# Patient Record
Sex: Female | Born: 1975 | Race: White | Hispanic: Yes | Marital: Married | Smoking: Never smoker
Health system: Southern US, Community
[De-identification: ages and names within clinical notes are randomized; demographics above are authoritative.]

## PROBLEM LIST (undated history)

## (undated) DIAGNOSIS — K219 Gastro-esophageal reflux disease without esophagitis: Secondary | ICD-10-CM

## (undated) HISTORY — DX: Gastro-esophageal reflux disease without esophagitis: K21.9

## (undated) HISTORY — PX: TUBAL LIGATION: SHX77

---

## 2008-04-25 ENCOUNTER — Ambulatory Visit: Payer: Self-pay | Admitting: Certified Nurse Midwife

## 2019-04-06 ENCOUNTER — Ambulatory Visit: Payer: Self-pay

## 2019-04-08 ENCOUNTER — Ambulatory Visit: Payer: Self-pay | Attending: Internal Medicine

## 2019-04-08 DIAGNOSIS — Z23 Encounter for immunization: Secondary | ICD-10-CM

## 2019-04-08 NOTE — Progress Notes (Signed)
   Covid-19 Vaccination Clinic  Name:  Linda Kaufman    MRN: [id number] DOB: 04-May-1975  04/08/2019  Ms. Linda Kaufman was observed post Covid-19 immunization for 15 minutes without incident. She was provided with Vaccine Information Sheet and instruction to access the V-Safe system.   Ms. Linda Kaufman was instructed to call 911 with any severe reactions post vaccine: Marland Kitchen Difficulty breathing  . Swelling of face and throat  . A fast heartbeat  . A bad rash all over body  . Dizziness and weakness   Immunizations Administered    Name Date Dose VIS Date Route   Pfizer COVID-19 Vaccine 04/08/2019  9:47 AM 0.3 mL 12/14/2018 Intramuscular   Manufacturer: [employer], [employer]   Lot: [id number]   NDC: [address]-3

## 2019-04-29 ENCOUNTER — Ambulatory Visit: Payer: Self-pay | Attending: Internal Medicine

## 2019-04-29 DIAGNOSIS — Z23 Encounter for immunization: Secondary | ICD-10-CM

## 2019-04-29 NOTE — Progress Notes (Signed)
   Covid-19 Vaccination Clinic  Name:  Linda Kaufman    MRN: [id number] DOB: Jul 16, 1975  04/29/2019  Ms. Linda Kaufman was observed post Covid-19 immunization for 15 minutes without incident. She was provided with Vaccine Information Sheet and instruction to access the V-Safe system.   Ms. Linda Kaufman was instructed to call 911 with any severe reactions post vaccine: Marland Kitchen Difficulty breathing  . Swelling of face and throat  . A fast heartbeat  . A bad rash all over body  . Dizziness and weakness   Immunizations Administered    Name Date Dose VIS Date Route   Pfizer COVID-19 Vaccine 04/29/2019 10:01 AM 0.3 mL 02/27/2018 Intramuscular   Manufacturer: [employer], [employer]   Lot: [id number]   NDC: [address]-4

## 2019-05-03 ENCOUNTER — Ambulatory Visit: Payer: Self-pay

## 2019-10-17 ENCOUNTER — Encounter: Payer: Self-pay | Admitting: *Deleted

## 2019-10-17 ENCOUNTER — Emergency Department: Payer: Self-pay

## 2019-10-17 ENCOUNTER — Other Ambulatory Visit: Payer: Self-pay

## 2019-10-17 ENCOUNTER — Emergency Department
Admission: EM | Admit: 2019-10-17 | Discharge: 2019-10-17 | Disposition: A | Discharge status: Home / Self Care | Payer: Self-pay | Attending: Student in an Organized Health Care Education/Training Program | Admitting: Student in an Organized Health Care Education/Training Program

## 2019-10-17 DIAGNOSIS — R1011 Right upper quadrant pain: Secondary | ICD-10-CM

## 2019-10-17 DIAGNOSIS — K8020 Calculus of gallbladder without cholecystitis without obstruction: Secondary | ICD-10-CM | POA: Insufficient documentation

## 2019-10-17 DIAGNOSIS — R1013 Epigastric pain: Secondary | ICD-10-CM

## 2019-10-17 LAB — POCT PREGNANCY, URINE: Preg Test, Ur: NEGATIVE

## 2019-10-17 LAB — URINALYSIS, COMPLETE (UACMP) WITH MICROSCOPIC
Bilirubin Urine: NEGATIVE
Glucose, UA: NEGATIVE mg/dL
Hgb urine dipstick: NEGATIVE
Ketones, ur: NEGATIVE mg/dL
Leukocytes,Ua: NEGATIVE
Nitrite: NEGATIVE
Protein, ur: NEGATIVE mg/dL
Specific Gravity, Urine: 1.011 (ref 1.005–1.030)
pH: 6 (ref 5.0–8.0)

## 2019-10-17 LAB — COMPREHENSIVE METABOLIC PANEL
ALT: 33 U/L (ref 0–44)
AST: 30 U/L (ref 15–41)
Albumin: 4.8 g/dL (ref 3.5–5.0)
Alkaline Phosphatase: 113 U/L (ref 38–126)
Anion gap: 13 (ref 5–15)
BUN: 9 mg/dL (ref 6–20)
CO2: 24 mmol/L (ref 22–32)
Calcium: 9.3 mg/dL (ref 8.9–10.3)
Chloride: 102 mmol/L (ref 98–111)
Creatinine, Ser: 0.56 mg/dL (ref 0.44–1.00)
GFR, Estimated: >60 mL/min (ref >60)
Glucose, Bld: 138 mg/dL — ABNORMAL HIGH (ref 70–99)
Potassium: 2.9 mmol/L — ABNORMAL LOW (ref 3.5–5.1)
Sodium: 139 mmol/L (ref 135–145)
Total Bilirubin: 0.7 mg/dL (ref 0.3–1.2)
Total Protein: 7.9 g/dL (ref 6.5–8.1)

## 2019-10-17 LAB — CBC
HCT: 42.1 % (ref 36.0–46.0)
Hemoglobin: 14.7 g/dL (ref 12.0–15.0)
MCH: 30.4 pg (ref 26.0–34.0)
MCHC: 34.9 g/dL (ref 30.0–36.0)
MCV: 87 fL (ref 80.0–100.0)
Platelets: 267 10*3/uL (ref 150–400)
RBC: 4.84 MIL/uL (ref 3.87–5.11)
RDW: 12.8 % (ref 11.5–15.5)
WBC: 8.1 10*3/uL (ref 4.0–10.5)
nRBC: 0 % (ref 0.0–0.2)

## 2019-10-17 LAB — LIPASE, BLOOD: Lipase: 23 U/L (ref 11–51)

## 2019-10-17 LAB — TROPONIN I (HIGH SENSITIVITY): Troponin I (High Sensitivity): 4 ng/L (ref <18)

## 2019-10-17 MED ORDER — ONDANSETRON HCL 4 MG/2ML IJ SOLN
4.0000 mg | Freq: Once | INTRAMUSCULAR | Status: AC | PRN
  Administered 2019-10-17: 4 mg via INTRAVENOUS
  Filled 2019-10-17: qty 2

## 2019-10-17 MED ORDER — HYDROCODONE-ACETAMINOPHEN 5-325 MG PO TABS
1.0000 | ORAL_TABLET | ORAL | 0 refills | Status: DC | PRN
Start: 2019-10-17 — End: 2022-07-28

## 2019-10-17 MED ORDER — FENTANYL CITRATE (PF) 100 MCG/2ML IJ SOLN
50.0000 ug | INTRAMUSCULAR | Status: DC | PRN
  Administered 2019-10-17: 50 ug via INTRAVENOUS
  Filled 2019-10-17: qty 2

## 2019-10-17 MED ORDER — ONDANSETRON HCL 4 MG PO TABS: 4.0000 mg | ORAL_TABLET | Freq: Every day | ORAL | 0 refills | Status: AC | PRN

## 2019-10-17 MED ORDER — ONDANSETRON 4 MG PO TBDP
ORAL_TABLET | ORAL | Status: AC
  Administered 2019-10-17: 4 mg via ORAL
  Filled 2019-10-17: qty 1

## 2019-10-17 MED ORDER — ONDANSETRON 4 MG PO TBDP: 4.0000 mg | ORAL_TABLET | Freq: Once | ORAL | Status: AC

## 2019-10-17 NOTE — ED Triage Notes (Signed)
Pt presents w/ epigastric pain starting x 1 hr ago. Pt has had epigastric pain like this before which has resolved w/o intervention and has never seen a physician about this. Pt is presently vomiting and diaphoretic.

## 2019-10-17 NOTE — ED Provider Notes (Signed)
[facility] Emergency Department Provider Note    First MD Initiated Contact with Patient 10/17/19 1214     (approximate)  I have reviewed the triage vital signs and the nursing notes.   HISTORY  Chief Complaint Abdominal Pain    HPI Linda Kaufman is a 44 y.o. female presents to the ER for evaluation of epigastric pain that awoke her from sleep last night.  States it did radiate through to her back.  Did not have any chest pain or shortness of breath associated with this.  No fevers.  Has had similar episodes in the past this 1 was more severe and lasted longer.  She denies any dysuria.  No melena.  No hematochezia.  No pressure.  Did have vomiting associated with nonbilious nonbloody.    History reviewed. No pertinent past medical history. History reviewed. No pertinent family history. History reviewed. No pertinent surgical history. There are no problems to display for this patient.     Prior to Admission medications   Medication Sig Start Date End Date Taking? Authorizing Provider  HYDROcodone-acetaminophen (NORCO) 5-325 MG tablet Take 1 tablet by mouth every 4 (four) hours as needed for moderate pain. 10/17/19   Willy Eddy, MD  ondansetron (ZOFRAN) 4 MG tablet Take 1 tablet (4 mg total) by mouth daily as needed. 10/17/19 10/16/20  Willy Eddy, MD    Allergies Patient has no known allergies.    Social History Social History   Tobacco Use  . Smoking status: Never Smoker  . Smokeless tobacco: Never Used  Vaping Use  . Vaping Use: Never used  Substance Use Topics  . Alcohol use: Never  . Drug use: Never    Review of Systems Patient denies headaches, rhinorrhea, blurry vision, numbness, shortness of breath, chest pain, edema, cough, abdominal pain, nausea, vomiting, diarrhea, dysuria, fevers, rashes or hallucinations unless otherwise stated above in HPI. ____________________________________________   PHYSICAL  EXAM:  VITAL SIGNS: Vitals:   10/17/19 0249 10/17/19 0555  BP: (!) 166/89 133/68  Pulse: 65 (!) 58  Resp: (!) 28 17  Temp: 97.9 F (36.6 C) 98.6 F (37 C)  SpO2: 100% 100%   Constitutional: Alert and oriented.  Eyes: Conjunctivae are normal.  Head: Atraumatic. Nose: No congestion/rhinnorhea. Mouth/Throat: Mucous membranes are moist.   Neck: No stridor. Painless ROM.  Cardiovascular: Normal rate, regular rhythm. Grossly normal heart sounds.  Good peripheral circulation. Respiratory: Normal respiratory effort.  No retractions. Lungs CTAB. Gastrointestinal: Soft without guarding or rebound in all four quadrants,  Mild epigastric ttp.  No distention. No abdominal bruits. No CVA tenderness. Genitourinary:  Musculoskeletal: No lower extremity tenderness nor edema.  No joint effusions. Neurologic:  Normal speech and language. No gross focal neurologic deficits are appreciated. No facial droop Skin:  Skin is warm, dry and intact. No rash noted. Psychiatric: Mood and affect are normal. Speech and behavior are normal.  ____________________________________________   LABS (all labs ordered are listed, but only abnormal results are displayed)  Results for orders placed or performed during the hospital encounter of 10/17/19 (from the past 24 hour(s))  Lipase, blood     Status: None   Collection Time: 10/17/19  3:07 AM  Result Value Ref Range   Lipase 23 11 - 51 U/L  Comprehensive metabolic panel     Status: Abnormal   Collection Time: 10/17/19  3:07 AM  Result Value Ref Range   Sodium 139 135 - 145 mmol/L   Potassium 2.9 (L) 3.5 -  5.1 mmol/L   Chloride 102 98 - 111 mmol/L   CO2 24 22 - 32 mmol/L   Glucose, Bld 138 (H) 70 - 99 mg/dL   BUN 9 6 - 20 mg/dL   Creatinine, Ser 0.86 0.44 - 1.00 mg/dL   Calcium 9.3 8.9 - 76.1 mg/dL   Total Protein 7.9 6.5 - 8.1 g/dL   Albumin 4.8 3.5 - 5.0 g/dL   AST 30 15 - 41 U/L   ALT 33 0 - 44 U/L   Alkaline Phosphatase 113 38 - 126 U/L   Total  Bilirubin 0.7 0.3 - 1.2 mg/dL   GFR, Estimated >95 >09 mL/min   Anion gap 13 5 - 15  CBC     Status: None   Collection Time: 10/17/19  3:07 AM  Result Value Ref Range   WBC 8.1 4.0 - 10.5 K/uL   RBC 4.84 3.87 - 5.11 MIL/uL   Hemoglobin 14.7 12.0 - 15.0 g/dL   HCT 32.6 36 - 46 %   MCV 87.0 80.0 - 100.0 fL   MCH 30.4 26.0 - 34.0 pg   MCHC 34.9 30.0 - 36.0 g/dL   RDW 71.2 45.8 - 09.9 %   Platelets 267 150 - 400 K/uL   nRBC 0.0 0.0 - 0.2 %  Urinalysis, Complete w Microscopic     Status: Abnormal   Collection Time: 10/17/19  3:07 AM  Result Value Ref Range   Color, Urine STRAW (A) YELLOW   APPearance CLEAR (A) CLEAR   Specific Gravity, Urine 1.011 1.005 - 1.030   pH 6.0 5.0 - 8.0   Glucose, UA NEGATIVE NEGATIVE mg/dL   Hgb urine dipstick NEGATIVE NEGATIVE   Bilirubin Urine NEGATIVE NEGATIVE   Ketones, ur NEGATIVE NEGATIVE mg/dL   Protein, ur NEGATIVE NEGATIVE mg/dL   Nitrite NEGATIVE NEGATIVE   Leukocytes,Ua NEGATIVE NEGATIVE   WBC, UA 0-5 0 - 5 WBC/hpf   Bacteria, UA RARE (A) NONE SEEN   Squamous Epithelial / LPF 0-5 0 - 5   Mucus PRESENT   Troponin I (High Sensitivity)     Status: None   Collection Time: 10/17/19  3:07 AM  Result Value Ref Range   Troponin I (High Sensitivity) 4 <18 ng/L  Pregnancy, urine POC     Status: None   Collection Time: 10/17/19  3:24 AM  Result Value Ref Range   Preg Test, Ur NEGATIVE NEGATIVE   ____________________________________________  EKG My review and personal interpretation at Time: 2:47   Indication: abd pain  Rate: 70  Rhythm: sinus Axis: mpr,a; Other: normal intervals, no stemi,  ____________________________________________  RADIOLOGY  I personally reviewed all radiographic images ordered to evaluate for the above acute complaints and reviewed radiology reports and findings.  These findings were personally discussed with the patient.  Please see medical record for radiology  report.  ____________________________________________   PROCEDURES  Procedure(s) performed:  Procedures    Critical Care performed: no ____________________________________________   INITIAL IMPRESSION / ASSESSMENT AND PLAN / ED COURSE  Pertinent labs & imaging results that were available during my care of the patient were reviewed by me and considered in my medical decision making (see chart for details).   DDX: Cholelithiasis, cholecystitis, pancreatitis, gastritis, enteritis, SBO, perforation, pneumonia, ACS  Linda Kaufman is a 44 y.o. who presents to the ED with presentation as described above.  Patient clinically well-appearing in no acute distress.  Has reassuring blood work as well as ultrasound showing evidence of cholelithiasis without evidence of  acute cholecystitis given her clinical presentation without fever right upper quadrant tenderness or leukocytosis.  No elevation of her bilirubin or transaminitis.  Lipase is normal.  No evidence of pneumonia.  Not consistent with ACS given nonischemic EKG and negative troponin.  Does have a history of gastritis and reflux.  May been having episode of that but I do suspect she may be having biliary colic given ultrasound findings therefore will give outpatient referral.  Discussed strict return precautions.  Clinical Course as of Oct 16 1324  Thu Oct 17, 2019  1322 Patient's work-up here is reassuring I do suspect biliary colic.  Repeat abdominal exam is benign.  Patient tolerating p.o.  Will give referral to outpatient specialist.  We discussed strict return precautions.  Have discussed with the patient and available family all diagnostics and treatments performed thus far and all questions were answered to the best of my ability. The patient demonstrates understanding and agreement with plan.    [PR]    Clinical Course User Index [PR] Willy Eddy, MD    The patient was evaluated in Emergency Department today for the  symptoms described in the history of present illness. He/she was evaluated in the context of the global COVID-19 pandemic, which necessitated consideration that the patient might be at risk for infection with the SARS-CoV-2 virus that causes COVID-19. Institutional protocols and algorithms that pertain to the evaluation of patients at risk for COVID-19 are in a state of rapid change based on information released by regulatory bodies including the CDC and federal and state organizations. These policies and algorithms were followed during the patient's care in the ED.  As part of my medical decision making, I reviewed the following data within the electronic MEDICAL RECORD NUMBER Nursing notes reviewed and incorporated, Labs reviewed, notes from prior ED visits and [place] Controlled Substance Database   ____________________________________________   FINAL CLINICAL IMPRESSION(S) / ED DIAGNOSES  Final diagnoses:  RUQ abdominal pain  Epigastric pain  Calculus of gallbladder without cholecystitis without obstruction      NEW MEDICATIONS STARTED DURING THIS VISIT:  New Prescriptions   HYDROCODONE-ACETAMINOPHEN (NORCO) 5-325 MG TABLET    Take 1 tablet by mouth every 4 (four) hours as needed for moderate pain.   ONDANSETRON (ZOFRAN) 4 MG TABLET    Take 1 tablet (4 mg total) by mouth daily as needed.     Note:  This document was prepared using Dragon voice recognition software and may include unintentional dictation errors.    Willy Eddy, MD 10/17/19 1326

## 2019-10-17 NOTE — ED Notes (Signed)
Tele-interpreter used for d/c instructions. Pt and family verbalized understanding at this time. Pt and family deny questions at this time.   E sign not working at this time

## 2019-10-17 NOTE — ED Notes (Signed)
Interpreter and X-Ray at bedside at this time

## 2019-10-23 ENCOUNTER — Ambulatory Visit: Payer: Self-pay | Admitting: Surgery

## 2019-10-23 NOTE — H&P (View-Only) (Signed)
Subjective:   CC: Biliary colic [K80.50]  HPI:  Linda Kaufman is a 44 y.o. female who was referred by Emergency Room for evaluation of above CC. Symptoms were first noted several days ago. Pain is sharp and worsening, confined to the epigastric area, without radiation.  Associated with nausea/vomting, exacerbated by nothing specific.  Pain intensity prompted ED visit, workup showed gallstones, so referred here.  Reports several similar episodes past year or so, not as intense as this time.  Has tried some PPIs with no resolution of episodes.  No issues inbetween these episodes, mostly occurs night, early am.     Past Medical History: none reported  Past Surgical History: none reported  Family History: none reported   Social History: denies any smoking, alcohol, illicit drug use  Current Medications: omeprazole  Allergies:  Allergies as of 10/22/2019  . (No Known Allergies)    ROS:  A 15 point review of systems was performed and pertinent positives and negatives noted in HPI    Objective:   BP 132/79   Pulse 69   Ht 147.3 cm (4\' 10" )   Wt 66 kg (145 lb 9.6 oz)   BMI 30.43 kg/m    Constitutional :  alert, appears stated age and cooperative  Lymphatics/Throat:  no asymmetry, masses, or scars  Respiratory:  clear to auscultation bilaterally  Cardiovascular:  regular rate and rhythm, S1, S2 normal, no murmur, click, rub or gallop  Gastrointestinal: soft, non-tender; bowel sounds normal; no masses,  no organomegaly.    Musculoskeletal: Steady gait and movement  Skin: Cool and moist  Psychiatric: Normal affect, non-agitated, not confused       LABS:  n/a   RADS: CLINICAL DATA: Right upper quadrant abdominal pain.   EXAM:  ULTRASOUND ABDOMEN LIMITED RIGHT UPPER QUADRANT   COMPARISON: None.   FINDINGS:  Gallbladder:   Multiple shadowing gallstones are present. The gallbladder is  contracted. Wall thickness is upper limits of normal at  3.5 mm.   Common bile duct:   Diameter: 6 mm, within normal limits.   Liver:   The liver is diffusely echogenic. No focal lesions are present.  Internal architecture is not well-defined. Portal vein is patent on  color Doppler imaging with normal direction of blood flow towards  the liver.   Other: None.   IMPRESSION:  1. Cholelithiasis.  2. Borderline gallbladder wall thickening is likely due to  contracted state without definite inflammatory change.  3. Diffuse fatty infiltration of the liver.    Electronically Signed  By: M.D.  On: 10/17/2019 04:56  Assessment:      Biliary colic [K80.50], history consistent with no other obvious source of pain, so will proceed with surgery.    Plan:   1. Biliary colic [K80.50] Discussed the risk of surgery including post-op infxn, seroma, biloma, chronic pain, poor-delayed wound healing, retained gallstone, conversion to open procedure, post-op SBO or ileus, and need for additional procedures to address said risks.  The risks of general anesthetic including MI, CVA, sudden death or even reaction to anesthetic medications also discussed. Alternatives include continued observation.  Benefits include possible symptom relief, prevention of complications including acute cholecystitis, pancreatitis.  Typical post operative recovery of 3-5 days rest, continued pain in area and incision sites, possible loose stools up to 4-6 weeks, also discussed.  ED return precautions given for sudden increase in RUQ pain, with possible accompanying fever, nausea, and/or vomiting.  The patient understands the risks, any and all questions  were answered to the patient's satisfaction.  2. Patient has elected to proceed with surgical treatment. Procedure will be scheduled.  Written consent was obtained..robotic assisted laparoscopic.  Pt understands this will be self pay with significant expense on her.    Entire encounter  performed via interpreter service.

## 2019-10-23 NOTE — H&P (Signed)
Subjective:   CC: Biliary colic [K80.50]  HPI:  Linda Kaufman is a 44 y.o. female who was referred by Emergency Room for evaluation of above CC. Symptoms were first noted several days ago. Pain is sharp and worsening, confined to the epigastric area, without radiation.  Associated with nausea/vomting, exacerbated by nothing specific.  Pain intensity prompted ED visit, workup showed gallstones, so referred here.  Reports several similar episodes past year or so, not as intense as this time.  Has tried some PPIs with no resolution of episodes.  No issues inbetween these episodes, mostly occurs night, early am.     Past Medical History: none reported  Past Surgical History: none reported  Family History: none reported   Social History: denies any smoking, alcohol, illicit drug use  Current Medications: omeprazole  Allergies:  Allergies as of 10/22/2019  . (No Known Allergies)    ROS:  A 15 point review of systems was performed and pertinent positives and negatives noted in HPI    Objective:   BP 132/79   Pulse 69   Ht 147.3 cm (4' 10")   Wt 66 kg (145 lb 9.6 oz)   BMI 30.43 kg/m    Constitutional :  alert, appears stated age and cooperative  Lymphatics/Throat:  no asymmetry, masses, or scars  Respiratory:  clear to auscultation bilaterally  Cardiovascular:  regular rate and rhythm, S1, S2 normal, no murmur, click, rub or gallop  Gastrointestinal: soft, non-tender; bowel sounds normal; no masses,  no organomegaly.    Musculoskeletal: Steady gait and movement  Skin: Cool and moist  Psychiatric: Normal affect, non-agitated, not confused       LABS:  n/a   RADS: CLINICAL DATA: Right upper quadrant abdominal pain.   EXAM:  ULTRASOUND ABDOMEN LIMITED RIGHT UPPER QUADRANT   COMPARISON: None.   FINDINGS:  Gallbladder:   Multiple shadowing gallstones are present. The gallbladder is  contracted. Wall thickness is upper limits of normal at  3.5 mm.   Common bile duct:   Diameter: 6 mm, within normal limits.   Liver:   The liver is diffusely echogenic. No focal lesions are present.  Internal architecture is not well-defined. Portal vein is patent on  color Doppler imaging with normal direction of blood flow towards  the liver.   Other: None.   IMPRESSION:  1. Cholelithiasis.  2. Borderline gallbladder wall thickening is likely due to  contracted state without definite inflammatory change.  3. Diffuse fatty infiltration of the liver.    Electronically Signed  By: Christopher Mattern M.D.  On: 10/17/2019 04:56  Assessment:      Biliary colic [K80.50], history consistent with no other obvious source of pain, so will proceed with surgery.    Plan:   1. Biliary colic [K80.50] Discussed the risk of surgery including post-op infxn, seroma, biloma, chronic pain, poor-delayed wound healing, retained gallstone, conversion to open procedure, post-op SBO or ileus, and need for additional procedures to address said risks.  The risks of general anesthetic including MI, CVA, sudden death or even reaction to anesthetic medications also discussed. Alternatives include continued observation.  Benefits include possible symptom relief, prevention of complications including acute cholecystitis, pancreatitis.  Typical post operative recovery of 3-5 days rest, continued pain in area and incision sites, possible loose stools up to 4-6 weeks, also discussed.  ED return precautions given for sudden increase in RUQ pain, with possible accompanying fever, nausea, and/or vomiting.  The patient understands the risks, any and all questions   were answered to the patient's satisfaction.  2. Patient has elected to proceed with surgical treatment. Procedure will be scheduled.  Written consent was obtained..robotic assisted laparoscopic.  Pt understands this will be self pay with significant expense on her.    Entire encounter  performed via interpreter service.

## 2019-10-30 ENCOUNTER — Other Ambulatory Visit: Payer: Self-pay

## 2019-10-30 ENCOUNTER — Encounter
Admission: RE | Admit: 2019-10-30 | Discharge: 2019-10-30 | Disposition: A | Discharge status: Home / Self Care | Payer: Self-pay | Source: Ambulatory Visit | Attending: Surgery | Admitting: Surgery

## 2019-10-30 NOTE — Patient Instructions (Signed)
Your procedure is scheduled on: Thursday November 07, 2019. Su procedimiento est programado para: Jueves 4 de [place] del 2021. Report to Day Surgery inside [facility] 2nd floor. Presntese a: [facility] 2ndo piso. To find out your arrival time please call [phone] between 1PM - 3PM on Wednesday November 06, 2019. Para saber su hora de llegada por favor llame al [phone] Eusebio Me la 1PM - 3PM el da: Miercoles 3 de [place] del 2021.   Remember: Instructions that are not followed completely may result in serious medical risk, up to and including death,  or upon the discretion of your surgeon and anesthesiologist your surgery may need to be rescheduled.  Recuerde: Las instrucciones que no se siguen completamente Armed forces logistics/support/administrative officer en un riesgo de salud grave, incluyendo hasta  la [place] o a discrecin de su cirujano y Scientific laboratory technician, su ciruga se puede posponer.   __X_ 1.Do not eat food after midnight the night before your procedure. No    gum chewing or hard candies. You may drink clear liquids up to 2 hours     before you are scheduled to arrive for your surgery- DO not drink clear     Liquids within 2 hours of the start of your surgery.     Clear Liquids include:    water, apple juice without pulp, clear carbohydrate drink such as    Clearfast of Gartorade, Black Coffee or Tea (Do not add anything to coffee or tea).      No coma nada despus de la medianoche de la noche anterior a su    procedimiento. No coma chicles ni caramelos duros. Puede tomar    lquidos claros hasta 2 horas antes de su hora programada de llegada al     hospital para su procedimiento. No tome lquidos claros durante el     transcurso de las 2 horas de su llegada programada al hospital para su     procedimiento, ya que esto puede llevar a que su procedimiento se    retrase o tenga que volver a Magazine features editor.  Los lquidos claros incluyen:          - Agua o jugo de [place]  sin pulpa          - Bebidas claras con carbohidratos como ClearFast o Gatorade          - Caf negro o t claro (sin leche, sin cremas, no agregue nada al caf ni al t)  No tome nada que no est en esta lista.  Los pacientes con diabetes tipo 1 y tipo 2 solo deben Printmaker.  Llame a la clnica de PreCare o a la unidad de Same Day Surgery si  tiene alguna pregunta sobre estas instrucciones.              _X__ 2.Do Not Smoke or use e-cigarettes For 24 Hours Prior to Your Surgery.    Do not use any chewable tobacco products for at least 6   hours prior to surgery.    No fume ni use cigarrillos electrnicos durante las 24 horas previas    a su [place].  No use ningn producto de tabaco masticable durante   al menos 6 horas antes de la [place].     __X_ 3. No alcohol for 24 hours before or after surgery.    No tome alcohol durante las 24 horas antes ni despus de la [place].   __x__4. Brush your teeth with toothpaste and water, you  may use mouthwash, Do no swallow any toothpaste or mouthwash.    Cepillece los dientes con agua y pasta de dientes, puede usar enjuage buccal, no se trague la pasta de dientes o enjuague buccal.   __x__ 5. Notify your doctor if there is any change in your medical condition (cold,fever, infections).    Informe a su mdico si hay algn cambio en su condicin mdica  (resfriado, fiebre, infecciones).   Do not wear jewelry, make-up, hairpins, clips or nail polish.  No use joyas, maquillajes, pinzas/ganchos para el cabello ni esmalte de uas.  Do not wear lotions, powders, or perfumes. You may wear deodorant.  No use lociones, polvos o perfumes.  Puede usar desodorante.    Do not shave 48 hours prior to surgery. Men may shave face and neck.  No se afeite 48 horas antes de la [place].  Los hombres pueden [employer] cara  y el cuello.   Do not bring valuables to the hospital.   No lleve objetos de valor al hospital.  [facility] is not responsible for any  belongings or valuables.  [place] no se hace responsable de ningn tipo de pertenencias u objetos de Licensed conveyancer.               Contacts, dentures or bridgework may not be worn into surgery.  Los lentes de [place], las dentaduras postizas o puentes no se pueden usar en la [place].   Leave your suitcase in the car. After surgery it may be brought to your room.  Deje su maleta en el auto.  Despus de la ciruga podr traerla a su habitacin.   For patients admitted to the hospital, discharge time is determined by your  treatment team.  Para los pacientes que sean ingresados al hospital, el tiempo en el cual se le  dar de alta es determinado por su equipo de [place].   Patients discharged the day of surgery will not be allowed to drive home. A los pacientes que se les da de alta el mismo da de la ciruga no se les permitir conducir a Higher education careers adviser.   __x__ Take these medicines the morning of surgery with A SIP OF WATER:          [employer] estas medicinas la maana de la ciruga con UN SORBO DE AGUA:  1. None     ____ Fleet Enema (as directed)          Enema de Fleet (segn lo indicado)    __x__ Use CHG Soap as directed          Utilice el jabn de CHG segn lo indicado  ____ Use inhalers on the day of surgery          Use los inhaladores el da de la ciruga  ____ Stop metformin 2 days prior to surgery          Deje de tomar el metformin 2 das antes de la ciruga    ____ Take 1/2 of usual insulin dose the night before surgery and none on the morning of surgery           Tome la mitad de la dosis habitual de insulina la noche antes de la [place] y no tome nada en la maana de la             ciruga  ____ Stop Coumadin/Plavix/aspirin on           Deje de tomar el Coumadin/Plavix/aspirina el da:  __x__ Stop Anti-inflammatories  such as Ibuprofen, Advil, Aleve, naproxen, aspirin, and or BC powders.          Deje de tomar antiinflamatorios como Ibuprofen, Advil, Aleve, naproxen, aspirinas  o polvos de BC powders   ___x_ Stop supplements until after surgery            Deje de tomar suplementos hasta despus de la ciruga  __x__ Do not start any herbal supplements before your surgery.  No empice a tomar suplementos de hierbas antes de su cirujia.

## 2019-11-05 ENCOUNTER — Ambulatory Visit
Admission: RE | Admit: 2019-11-05 | Discharge: 2019-11-05 | Disposition: A | Discharge status: Home / Self Care | Payer: HRSA Program | Source: Ambulatory Visit | Attending: Surgery | Admitting: Surgery

## 2019-11-05 ENCOUNTER — Other Ambulatory Visit: Payer: Self-pay

## 2019-11-05 DIAGNOSIS — Z20822 Contact with and (suspected) exposure to covid-19: Secondary | ICD-10-CM | POA: Diagnosis not present

## 2019-11-05 DIAGNOSIS — Z01812 Encounter for preprocedural laboratory examination: Secondary | ICD-10-CM | POA: Diagnosis not present

## 2019-11-05 LAB — SARS CORONAVIRUS 2 (TAT 6-24 HRS): SARS Coronavirus 2: NEGATIVE

## 2019-11-07 ENCOUNTER — Encounter
Admission: RE | Disposition: A | Discharge status: Home / Self Care | Payer: Self-pay | Source: Home / Self Care | Attending: Surgery

## 2019-11-07 ENCOUNTER — Ambulatory Visit
Admission: RE | Admit: 2019-11-07 | Discharge: 2019-11-07 | Disposition: A | Discharge status: Home / Self Care | Payer: Self-pay | Attending: Surgery | Admitting: Surgery

## 2019-11-07 ENCOUNTER — Encounter: Payer: Self-pay | Admitting: Surgery

## 2019-11-07 ENCOUNTER — Ambulatory Visit: Payer: Self-pay | Admitting: Anesthesiology

## 2019-11-07 ENCOUNTER — Other Ambulatory Visit: Payer: Self-pay

## 2019-11-07 DIAGNOSIS — K8010 Calculus of gallbladder with chronic cholecystitis without obstruction: Secondary | ICD-10-CM | POA: Insufficient documentation

## 2019-11-07 DIAGNOSIS — K810 Acute cholecystitis: Secondary | ICD-10-CM

## 2019-11-07 LAB — POCT PREGNANCY, URINE: Preg Test, Ur: NEGATIVE

## 2019-11-07 SURGERY — CHOLECYSTECTOMY, ROBOT-ASSISTED, LAPAROSCOPIC
Anesthesia: General | Site: Abdomen

## 2019-11-07 MED ORDER — CHLORHEXIDINE GLUCONATE 0.12 % MT SOLN
OROMUCOSAL | Status: AC
  Administered 2019-11-07: 15 mL via OROMUCOSAL
  Filled 2019-11-07: qty 15

## 2019-11-07 MED ORDER — ACETAMINOPHEN 500 MG PO TABS
ORAL_TABLET | ORAL | Status: AC
  Administered 2019-11-07: 1000 mg via ORAL
  Filled 2019-11-07: qty 2

## 2019-11-07 MED ORDER — MIDAZOLAM HCL 2 MG/2ML IJ SOLN
INTRAMUSCULAR | Status: AC
  Filled 2019-11-07: qty 2

## 2019-11-07 MED ORDER — ONDANSETRON HCL 4 MG/2ML IJ SOLN
INTRAMUSCULAR | Status: DC | PRN
  Administered 2019-11-07: 4 mg via INTRAVENOUS

## 2019-11-07 MED ORDER — GABAPENTIN 300 MG PO CAPS
ORAL_CAPSULE | ORAL | Status: AC
  Administered 2019-11-07: 300 mg via ORAL
  Filled 2019-11-07: qty 1

## 2019-11-07 MED ORDER — ORAL CARE MOUTH RINSE: 15.0000 mL | Freq: Once | OROMUCOSAL | Status: AC

## 2019-11-07 MED ORDER — PHENYLEPHRINE HCL (PRESSORS) 10 MG/ML IV SOLN
INTRAVENOUS | Status: DC | PRN
  Administered 2019-11-07: 100 ug via INTRAVENOUS
  Administered 2019-11-07: 200 ug via INTRAVENOUS
  Administered 2019-11-07 (×3): 100 ug via INTRAVENOUS
  Administered 2019-11-07: 200 ug via INTRAVENOUS
  Administered 2019-11-07 (×2): 100 ug via INTRAVENOUS

## 2019-11-07 MED ORDER — LIDOCAINE HCL (CARDIAC) PF 100 MG/5ML IV SOSY
PREFILLED_SYRINGE | INTRAVENOUS | Status: DC | PRN
  Administered 2019-11-07: 80 mg via INTRAVENOUS

## 2019-11-07 MED ORDER — METOCLOPRAMIDE HCL 5 MG/ML IJ SOLN
10.0000 mg | Freq: Once | INTRAMUSCULAR | Status: AC
  Administered 2019-11-07: 10 mg via INTRAVENOUS

## 2019-11-07 MED ORDER — ALBUTEROL SULFATE HFA 108 (90 BASE) MCG/ACT IN AERS
INHALATION_SPRAY | RESPIRATORY_TRACT | Status: DC | PRN
  Administered 2019-11-07: 4 via RESPIRATORY_TRACT

## 2019-11-07 MED ORDER — INDOCYANINE GREEN 25 MG IV SOLR
1.2500 mg | Freq: Once | INTRAVENOUS | Status: AC
  Administered 2019-11-07: 1.25 mg via INTRAVENOUS
  Filled 2019-11-07: qty 0.5

## 2019-11-07 MED ORDER — PROPOFOL 10 MG/ML IV BOLUS
INTRAVENOUS | Status: AC
  Filled 2019-11-07: qty 40

## 2019-11-07 MED ORDER — ACETAMINOPHEN 325 MG PO TABS: 325.0000 mg | ORAL_TABLET | ORAL | Status: DC | PRN

## 2019-11-07 MED ORDER — CEFAZOLIN SODIUM-DEXTROSE 2-4 GM/100ML-% IV SOLN
INTRAVENOUS | Status: AC
  Filled 2019-11-07: qty 100

## 2019-11-07 MED ORDER — FAMOTIDINE 20 MG PO TABS
ORAL_TABLET | ORAL | Status: AC
  Filled 2019-11-07: qty 1

## 2019-11-07 MED ORDER — PROMETHAZINE HCL 25 MG/ML IJ SOLN
INTRAMUSCULAR | Status: AC
  Administered 2019-11-07: 6.25 mg via INTRAVENOUS
  Filled 2019-11-07: qty 1

## 2019-11-07 MED ORDER — BUPIVACAINE HCL (PF) 0.5 % IJ SOLN
INTRAMUSCULAR | Status: DC | PRN
  Administered 2019-11-07: 10 mL

## 2019-11-07 MED ORDER — MEPERIDINE HCL 50 MG/ML IJ SOLN: 6.2500 mg | INTRAMUSCULAR | Status: DC | PRN

## 2019-11-07 MED ORDER — ACETAMINOPHEN 160 MG/5ML PO SOLN
325.0000 mg | ORAL | Status: DC | PRN
  Filled 2019-11-07: qty 20.3

## 2019-11-07 MED ORDER — CHLORHEXIDINE GLUCONATE CLOTH 2 % EX PADS
6.0000 | MEDICATED_PAD | Freq: Once | CUTANEOUS | Status: AC
  Administered 2019-11-07: 6 via TOPICAL

## 2019-11-07 MED ORDER — DEXMEDETOMIDINE (PRECEDEX) IN NS 20 MCG/5ML (4 MCG/ML) IV SYRINGE
PREFILLED_SYRINGE | INTRAVENOUS | Status: AC
  Filled 2019-11-07: qty 5

## 2019-11-07 MED ORDER — DEXAMETHASONE SODIUM PHOSPHATE 10 MG/ML IJ SOLN
INTRAMUSCULAR | Status: AC
  Filled 2019-11-07: qty 1

## 2019-11-07 MED ORDER — FAMOTIDINE 20 MG PO TABS: 20.0000 mg | ORAL_TABLET | Freq: Once | ORAL | Status: DC

## 2019-11-07 MED ORDER — METOCLOPRAMIDE HCL 5 MG/ML IJ SOLN
INTRAMUSCULAR | Status: AC
  Filled 2019-11-07: qty 2

## 2019-11-07 MED ORDER — DEXAMETHASONE SODIUM PHOSPHATE 10 MG/ML IJ SOLN
INTRAMUSCULAR | Status: DC | PRN
  Administered 2019-11-07: 10 mg via INTRAVENOUS

## 2019-11-07 MED ORDER — KETOROLAC TROMETHAMINE 30 MG/ML IJ SOLN: 30.0000 mg | Freq: Once | INTRAMUSCULAR | Status: DC | PRN

## 2019-11-07 MED ORDER — DOCUSATE SODIUM 100 MG PO CAPS: 100.0000 mg | ORAL_CAPSULE | Freq: Two times a day (BID) | ORAL | 0 refills | Status: AC | PRN

## 2019-11-07 MED ORDER — KETOROLAC TROMETHAMINE 30 MG/ML IJ SOLN
INTRAMUSCULAR | Status: AC
  Filled 2019-11-07: qty 1

## 2019-11-07 MED ORDER — IBUPROFEN 800 MG PO TABS: 800.0000 mg | ORAL_TABLET | Freq: Three times a day (TID) | ORAL | 0 refills | Status: AC | PRN

## 2019-11-07 MED ORDER — ACETAMINOPHEN 325 MG PO TABS: 650.0000 mg | ORAL_TABLET | Freq: Three times a day (TID) | ORAL | 0 refills | Status: AC | PRN

## 2019-11-07 MED ORDER — FENTANYL CITRATE (PF) 100 MCG/2ML IJ SOLN: 25.0000 ug | INTRAMUSCULAR | Status: DC | PRN

## 2019-11-07 MED ORDER — LIDOCAINE-EPINEPHRINE (PF) 1 %-1:200000 IJ SOLN
INTRAMUSCULAR | Status: AC
  Filled 2019-11-07: qty 30

## 2019-11-07 MED ORDER — CHLORHEXIDINE GLUCONATE 0.12 % MT SOLN: 15.0000 mL | Freq: Once | OROMUCOSAL | Status: AC

## 2019-11-07 MED ORDER — DROPERIDOL 2.5 MG/ML IJ SOLN
0.6250 mg | Freq: Once | INTRAMUSCULAR | Status: DC | PRN
  Filled 2019-11-07: qty 2

## 2019-11-07 MED ORDER — LIDOCAINE HCL (PF) 2 % IJ SOLN
INTRAMUSCULAR | Status: AC
  Filled 2019-11-07: qty 5

## 2019-11-07 MED ORDER — PROPOFOL 10 MG/ML IV BOLUS
INTRAVENOUS | Status: DC | PRN
  Administered 2019-11-07: 120 mg via INTRAVENOUS

## 2019-11-07 MED ORDER — CELECOXIB 200 MG PO CAPS: 200.0000 mg | ORAL_CAPSULE | ORAL | Status: AC

## 2019-11-07 MED ORDER — CELECOXIB 200 MG PO CAPS
ORAL_CAPSULE | ORAL | Status: AC
  Administered 2019-11-07: 200 mg via ORAL
  Filled 2019-11-07: qty 1

## 2019-11-07 MED ORDER — BUPIVACAINE HCL (PF) 0.5 % IJ SOLN
INTRAMUSCULAR | Status: AC
  Filled 2019-11-07: qty 30

## 2019-11-07 MED ORDER — SUGAMMADEX SODIUM 200 MG/2ML IV SOLN
INTRAVENOUS | Status: DC | PRN
  Administered 2019-11-07: 200 mg via INTRAVENOUS

## 2019-11-07 MED ORDER — LACTATED RINGERS IV SOLN: INTRAVENOUS | Status: DC

## 2019-11-07 MED ORDER — PROMETHAZINE HCL 25 MG/ML IJ SOLN: 6.2500 mg | INTRAMUSCULAR | Status: DC | PRN

## 2019-11-07 MED ORDER — LIDOCAINE-EPINEPHRINE (PF) 1 %-1:200000 IJ SOLN
INTRAMUSCULAR | Status: DC | PRN
  Administered 2019-11-07: 10 mL

## 2019-11-07 MED ORDER — PHENYLEPHRINE HCL (PRESSORS) 10 MG/ML IV SOLN
INTRAVENOUS | Status: AC
  Filled 2019-11-07: qty 1

## 2019-11-07 MED ORDER — DEXMEDETOMIDINE (PRECEDEX) IN NS 20 MCG/5ML (4 MCG/ML) IV SYRINGE
PREFILLED_SYRINGE | INTRAVENOUS | Status: DC | PRN
  Administered 2019-11-07 (×2): 8 ug via INTRAVENOUS

## 2019-11-07 MED ORDER — ACETAMINOPHEN 325 MG PO TABS
ORAL_TABLET | ORAL | Status: AC
  Administered 2019-11-07: 650 mg via ORAL
  Filled 2019-11-07: qty 2

## 2019-11-07 MED ORDER — HYDROCODONE-ACETAMINOPHEN 5-325 MG PO TABS: 1.0000 | ORAL_TABLET | Freq: Four times a day (QID) | ORAL | 0 refills | Status: DC | PRN

## 2019-11-07 MED ORDER — FENTANYL CITRATE (PF) 100 MCG/2ML IJ SOLN
INTRAMUSCULAR | Status: AC
  Filled 2019-11-07: qty 2

## 2019-11-07 MED ORDER — ACETAMINOPHEN 500 MG PO TABS: 1000.0000 mg | ORAL_TABLET | ORAL | Status: AC

## 2019-11-07 MED ORDER — GABAPENTIN 300 MG PO CAPS: 300.0000 mg | ORAL_CAPSULE | ORAL | Status: AC

## 2019-11-07 MED ORDER — FENTANYL CITRATE (PF) 100 MCG/2ML IJ SOLN
INTRAMUSCULAR | Status: DC | PRN
  Administered 2019-11-07 (×2): 50 ug via INTRAVENOUS

## 2019-11-07 MED ORDER — MIDAZOLAM HCL 2 MG/2ML IJ SOLN
INTRAMUSCULAR | Status: DC | PRN
  Administered 2019-11-07: 2 mg via INTRAVENOUS

## 2019-11-07 MED ORDER — ROCURONIUM BROMIDE 100 MG/10ML IV SOLN
INTRAVENOUS | Status: DC | PRN
  Administered 2019-11-07: 40 mg via INTRAVENOUS
  Administered 2019-11-07: 20 mg via INTRAVENOUS

## 2019-11-07 MED ORDER — ONDANSETRON HCL 4 MG/2ML IJ SOLN
INTRAMUSCULAR | Status: AC
  Filled 2019-11-07: qty 2

## 2019-11-07 MED ORDER — SODIUM CHLORIDE FLUSH 0.9 % IV SOLN
INTRAVENOUS | Status: AC
  Filled 2019-11-07: qty 10

## 2019-11-07 MED ORDER — HYDROCODONE-ACETAMINOPHEN 7.5-325 MG PO TABS
1.0000 | ORAL_TABLET | Freq: Once | ORAL | Status: DC | PRN
  Filled 2019-11-07: qty 1

## 2019-11-07 MED ORDER — CEFAZOLIN SODIUM-DEXTROSE 2-4 GM/100ML-% IV SOLN
2.0000 g | INTRAVENOUS | Status: AC
  Administered 2019-11-07: 2 g via INTRAVENOUS

## 2019-11-07 SURGICAL SUPPLY — 58 items
ADH SKN CLS APL DERMABOND .7 (GAUZE/BANDAGES/DRESSINGS) ×1
ANCHOR TIS RET SYS 235ML (MISCELLANEOUS) ×3 IMPLANT
APL PRP STRL LF DISP 70% ISPRP (MISCELLANEOUS) ×1
BAG INFUSER PRESSURE 100CC (MISCELLANEOUS) IMPLANT
BAG TISS RTRVL C235 10X14 (MISCELLANEOUS) ×1
BLADE SURG SZ11 CARB STEEL (BLADE) ×3 IMPLANT
CANISTER SUCT 1200ML W/VALVE (MISCELLANEOUS) IMPLANT
CANNULA REDUC XI 12-8 STAPL (CANNULA) ×1
CANNULA REDUC XI 12-8MM STAPL (CANNULA) ×1
CANNULA REDUCER 12-8 DVNC XI (CANNULA) ×1 IMPLANT
CATH REDDICK CHOLANGI 4FR 50CM (CATHETERS) IMPLANT
CHLORAPREP W/TINT 26 (MISCELLANEOUS) ×3 IMPLANT
CLIP VESOLOCK MED LG 6/CT (CLIP) ×3 IMPLANT
COVER TIP SHEARS 8 DVNC (MISCELLANEOUS) ×1 IMPLANT
COVER TIP SHEARS 8MM DA VINCI (MISCELLANEOUS) ×2
COVER WAND RF STERILE (DRAPES) ×3 IMPLANT
DECANTER SPIKE VIAL GLASS SM (MISCELLANEOUS) ×6 IMPLANT
DEFOGGER SCOPE WARMER CLEARIFY (MISCELLANEOUS) ×3 IMPLANT
DERMABOND ADVANCED (GAUZE/BANDAGES/DRESSINGS) ×2
DERMABOND ADVANCED .7 DNX12 (GAUZE/BANDAGES/DRESSINGS) ×1 IMPLANT
DRAPE ARM DVNC X/XI (DISPOSABLE) ×4 IMPLANT
DRAPE C-ARM XRAY 36X54 (DRAPES) IMPLANT
DRAPE COLUMN DVNC XI (DISPOSABLE) ×1 IMPLANT
DRAPE DA VINCI XI ARM (DISPOSABLE) ×8
DRAPE DA VINCI XI COLUMN (DISPOSABLE) ×2
ELECT CAUTERY BLADE 6.4 (BLADE) ×3 IMPLANT
ELECT REM PT RETURN 9FT ADLT (ELECTROSURGICAL) ×3
ELECTRODE REM PT RTRN 9FT ADLT (ELECTROSURGICAL) ×1 IMPLANT
GLOVE BIOGEL PI IND STRL 7.0 (GLOVE) ×4 IMPLANT
GLOVE BIOGEL PI INDICATOR 7.0 (GLOVE) ×8
GLOVE SURG SYN 6.5 ES PF (GLOVE) ×12 IMPLANT
GOWN STRL REUS W/ TWL LRG LVL3 (GOWN DISPOSABLE) ×4 IMPLANT
GOWN STRL REUS W/TWL LRG LVL3 (GOWN DISPOSABLE) ×12
GRASPER SUT TROCAR 14GX15 (MISCELLANEOUS) IMPLANT
IRRIGATOR SUCT 8 DISP DVNC XI (IRRIGATION / IRRIGATOR) IMPLANT
IRRIGATOR SUCTION 8MM XI DISP (IRRIGATION / IRRIGATOR)
IV NS 1000ML (IV SOLUTION)
IV NS 1000ML BAXH (IV SOLUTION) IMPLANT
LABEL OR SOLS (LABEL) ×3 IMPLANT
NEEDLE HYPO 22GX1.5 SAFETY (NEEDLE) ×3 IMPLANT
NEEDLE INSUFFLATION 14GA 120MM (NEEDLE) ×3 IMPLANT
NS IRRIG 500ML POUR BTL (IV SOLUTION) ×3 IMPLANT
OBTURATOR OPTICAL STANDARD 8MM (TROCAR) ×2
OBTURATOR OPTICAL STND 8 DVNC (TROCAR) ×1
OBTURATOR OPTICALSTD 8 DVNC (TROCAR) ×1 IMPLANT
PACK LAP CHOLECYSTECTOMY (MISCELLANEOUS) ×3 IMPLANT
PENCIL ELECTRO HAND CTR (MISCELLANEOUS) ×3 IMPLANT
SEAL CANN UNIV 5-8 DVNC XI (MISCELLANEOUS) ×3 IMPLANT
SEAL XI 5MM-8MM UNIVERSAL (MISCELLANEOUS) ×6
SET TUBE SMOKE EVAC HIGH FLOW (TUBING) ×3 IMPLANT
SOLUTION ELECTROLUBE (MISCELLANEOUS) ×3 IMPLANT
STAPLER CANNULA SEAL DVNC XI (STAPLE) ×1 IMPLANT
STAPLER CANNULA SEAL XI (STAPLE) ×2
SUT MNCRL 4-0 (SUTURE) ×12
SUT MNCRL 4-0 27XMFL (SUTURE) ×4
SUT VICRYL 0 AB UR-6 (SUTURE) ×6 IMPLANT
SUTURE MNCRL 4-0 27XMF (SUTURE) ×4 IMPLANT
SYR 30ML LL (SYRINGE) IMPLANT

## 2019-11-07 NOTE — Anesthesia Preprocedure Evaluation (Signed)
Anesthesia Evaluation  Patient identified by MRN, date of birth, ID band Patient awake    Reviewed: Allergy & Precautions, H&P , NPO status , reviewed documented beta blocker date and time   Airway Mallampati: II  TM Distance: >3 FB Neck ROM: full    Dental  (+) Teeth Intact   Pulmonary    Pulmonary exam normal        Cardiovascular Normal cardiovascular exam     Neuro/Psych    GI/Hepatic neg GERD  ,No current sx's of GERD   Endo/Other  Moderate obesity  Renal/GU      Musculoskeletal   Abdominal   Peds  Hematology   Anesthesia Other Findings History reviewed. No pertinent past medical history. History reviewed. No pertinent surgical history.   Reproductive/Obstetrics                             Anesthesia Physical Anesthesia Plan  ASA: II  Anesthesia Plan: General ETT   Post-op Pain Management:    Induction: Intravenous  PONV Risk Score and Plan: 3 and Ondansetron, Treatment may vary due to age or medical condition, Midazolam and Diphenhydramine  Airway Management Planned: Oral ETT  Additional Equipment:   Intra-op Plan:   Post-operative Plan: Extubation in OR  Informed Consent: I have reviewed the patients History and Physical, chart, labs and discussed the procedure including the risks, benefits and alternatives for the proposed anesthesia with the patient or authorized representative who has indicated his/her understanding and acceptance.     Dental Advisory Given  Plan Discussed with: CRNA  Anesthesia Plan Comments: (Interpreter used for interview)        Anesthesia Quick Evaluation

## 2019-11-07 NOTE — Discharge Instructions (Signed)
Colecistectoma laparoscpica, cuidados posteriores Laparoscopic Cholecystectomy, Care After Lea esta informacin sobre cmo cuidarse despus del procedimiento. El mdico tambin podr darle indicaciones ms especficas. Si tiene problemas o preguntas, llame al mdico. Siga estas indicaciones en su casa: Cuidado de los cortes de la ciruga (incisiones)   Siga las indicaciones del mdico en lo que respecta al cuidado de los cortes de la [place]. Haga lo siguiente: ? Lvese las manos con agua y jabn antes de Multimedia programmer las vendas (vendaje). Use un desinfectante para manos si no dispone de [place] y [place]. ? Cambie el vendaje como se lo haya indicado el mdico. ? No retire los puntos (suturas), la goma para cerrar la piel o las tiras [place]. Tal vez deban dejarse puestos en la piel durante 2semanas o ms tiempo. Si las tiras [place] se despegan y se enroscan, puede recortar los bordes sueltos. No retire las tiras [employer] por completo a menos que el mdico lo autorice.  No tome baos de inmersin, no practique natacin ni use el jacuzzi hasta que el mdico lo autorice. Pregntele al mdico si puede ducharse. Delle Reining solo le permitan tomar baos de [place].  Controle la zona alrededor del corte todos los das para detectar signos de infeccin. Est atento a los siguientes signos: ? Aumento del enrojecimiento, de la hinchazn o del dolor. ? Ms lquido [place]. ? Calor. ? Pus o mal olor. Actividad  No conduzca ni use maquinaria pesada mientras toma analgsicos recetados.  No levante ningn objeto que pese ms de 10libras (4,5kg) hasta que el mdico lo autorice.  No practique deportes de contacto hasta que el mdico lo autorice.  No conduzca durante 24horas si le dieron un medicamento para ayudarlo a que se relaje (sedante).  Descanse todo lo que sea necesario. No retome el trabajo ni el estudio hasta que el mdico lo autorice. Instrucciones generales  [employer] de venta  libre y los recetados solamente como se lo haya indicado el mdico.  A fin de prevenir o tratar el estreimiento mientras toma analgsicos recetados, el mdico puede recomendarle lo siguiente: ? Product manager suficiente lquido para Pharmacologist el pis (orina) claro o de color amarillo plido. ? Tomar medicamentos recetados o de [employer]. ? Consumir alimentos ricos en fibra, como frutas y verduras frescas, cereales integrales y frijoles. ? Limitar el consumo de alimentos con alto contenido de grasas y azcares procesados, como alimentos fritos o dulces. Comunquese con un mdico si:  Le aparece una erupcin cutnea.  Tiene ms enrojecimiento, hinchazn o dolor alrededor [employer] cortes de la [place].  Aumenta la cantidad de lquido o sangre que sale de los cortes.  Los cortes de la ciruga se sienten calientes al tacto.  Tiene pus o percibe mal olor que [employer] cortes.  Tiene fiebre.  Se abren uno o ms de los cortes. Solicite ayuda de inmediato si:  Tiene dificultad para respirar.  Siente dolor en el pecho.  Siente dolor que empeora en la zona de los hombros.  Se desmaya o se siente mareado al ponerse de pie.  Tiene dolor muy intenso de vientre (abdomen).  Tiene malestar estomacal (nuseas) que dura ms de Civil engineer, contracting.  Vomita durante ms de Civil engineer, contracting.  Siente dolor en la pierna. Esta informacin no tiene Theme park manager el consejo del mdico. Asegrese de hacerle al mdico cualquier pregunta que tenga. Document Revised: 03/28/2016 Document Reviewed: 06/08/2015 Elsevier Patient Education  2020 Elsevier Inc. AMBULATORY SURGERY  DISCHARGE INSTRUCTIONS   1) The  drugs that you were given will stay in your system until tomorrow so for the next 24 hours you should not:  A) Drive an automobile B) Make any legal decisions C) Drink any alcoholic beverage   2) You may resume regular meals tomorrow.  Today it is better to start with liquids and gradually work up to solid  foods.  You may eat anything you prefer, but it is better to start with liquids, then soup and crackers, and gradually work up to solid foods.   3) Please notify your doctor immediately if you have any unusual bleeding, trouble breathing, redness and pain at the surgery site, drainage, fever, or pain not relieved by medication. 4)   5) Your post-operative visit with Dr.                                     is: Date:                        Time:    Please call to schedule your post-operative visit.  6) Additional Instructions:      Laparoscopic Cholecystectomy, Care After This sheet gives you information about how to care for yourself after your procedure. Your doctor may also give you more specific instructions. If you have problems or questions, contact your doctor. Follow these instructions at home: Care for cuts from surgery (incisions)   Follow instructions from your doctor about how to take care of your cuts from surgery. Make sure you: ? Wash your hands with soap and water before you change your bandage (dressing). If you cannot use soap and water, use hand sanitizer. ? Change your bandage as told by your doctor. ? Leave stitches (sutures), skin glue, or skin tape (adhesive) strips in place. They may need to stay in place for 2 weeks or longer. If tape strips get loose and curl up, you may trim the loose edges. Do not remove tape strips completely unless your doctor says it is okay.  Do not take baths, swim, or use a hot tub until your doctor says it is okay. OK TO SHOWER 24HRS AFTER YOUR SURGERY.   Check your surgical cut area every day for signs of infection. Check for: ? More redness, swelling, or pain. ? More fluid or blood. ? Warmth. ? Pus or a bad smell. Activity  Do not drive or use heavy machinery while taking prescription pain medicine.  Do not play contact sports until your doctor says it is okay.  Do not drive for 24 hours if you were given a medicine to  help you relax (sedative).  Rest as needed. Do not return to work or school until your doctor says it is okay. General instructions .  tylenol and advil as needed for discomfort.  Please alternate between the two every four hours as needed for pain.   .  Use narcotics, if prescribed, only when tylenol and motrin is not enough to control pain. .  325-650mg  every 8hrs to max of 3000mg /24hrs (including the 325mg  in every norco dose) for the tylenol.   .  Advil up to 800mg  per dose every 8hrs as needed for pain.    To prevent or treat constipation while you are taking prescription pain medicine, your doctor may recommend that you: ? Drink enough fluid to keep your pee (urine) clear or pale yellow. ? Take over-the-counter or  prescription medicines. ? Eat foods that are high in fiber, such as fresh fruits and vegetables, whole grains, and beans. ? Limit foods that are high in fat and processed sugars, such as fried and sweet foods. Contact a doctor if:  You develop a rash.  You have more redness, swelling, or pain around your surgical cuts.  You have more fluid or blood coming from your surgical cuts.  Your surgical cuts feel warm to the touch.  You have pus or a bad smell coming from your surgical cuts.  You have a fever.  One or more of your surgical cuts breaks open. Get help right away if:  You have trouble breathing.  You have chest pain.  You have pain that is getting worse in your shoulders.  You faint or feel dizzy when you stand.  You have very bad pain in your belly (abdomen).  You are sick to your stomach (nauseous) for more than one day.  You have throwing up (vomiting) that lasts for more than one day.  You have leg pain. This information is not intended to replace advice given to you by your health care provider. Make sure you discuss any questions you have with your health care provider. Document Released: 09/29/2007 Document Revised: 07/11/2015 Document  Reviewed: 06/08/2015 Elsevier Interactive Patient Education  2019 [employer].

## 2019-11-07 NOTE — Transfer of Care (Signed)
Immediate Anesthesia Transfer of Care Note  Patient: Linda Kaufman  Procedure(s) Performed: XI ROBOTIC ASSISTED LAPAROSCOPIC CHOLECYSTECTOMY (N/A Abdomen)  Patient Location: PACU  Anesthesia Type:General  Level of Consciousness: drowsy and patient cooperative  Airway & Oxygen Therapy: Patient Spontanous Breathing and Patient connected to face mask oxygen  Post-op Assessment: Report given to RN and Post -op Vital signs reviewed and stable  Post vital signs: Reviewed and stable  Last Vitals:  Vitals Value Taken Time  BP 136/82 11/07/19 1243  Temp 36.6 C 11/07/19 1243  Pulse 85 11/07/19 1246  Resp 17 11/07/19 1246  SpO2 99 % 11/07/19 1246  Vitals shown include unvalidated device data.  Last Pain:  Vitals:   11/07/19 1243  TempSrc:   PainSc: Asleep         Complications: No complications documented.

## 2019-11-07 NOTE — Progress Notes (Signed)
All discharge instructions reviewed in detail with interpretor to husband and patient. All questions answered. Verbalized understanding

## 2019-11-07 NOTE — Anesthesia Postprocedure Evaluation (Signed)
Anesthesia Post Note  Patient: Linda Kaufman  Procedure(s) Performed: XI ROBOTIC ASSISTED LAPAROSCOPIC CHOLECYSTECTOMY (N/A Abdomen)  Patient location during evaluation: PACU Anesthesia Type: General Level of consciousness: awake and alert Pain management: pain level controlled Vital Signs Assessment: post-procedure vital signs reviewed and stable Respiratory status: spontaneous breathing, nonlabored ventilation and respiratory function stable Cardiovascular status: blood pressure returned to baseline and stable Postop Assessment: no apparent nausea or vomiting Anesthetic complications: no   No complications documented.   Last Vitals:  Vitals:   11/07/19 1415 11/07/19 1428  BP: (!) 145/88 137/79  Pulse: 76 78  Resp: 18 19  Temp:    SpO2: 96% 97%    Last Pain:  Vitals:   11/07/19 1428  TempSrc:   PainSc: Asleep                 Christia Reading

## 2019-11-07 NOTE — Op Note (Signed)
Preoperative diagnosis:  chronic and cholecystitis  Postoperative diagnosis: same as above  Procedure: Robotic assisted Laparoscopic Cholecystectomy.   Anesthesia: GETA   Surgeon: Jayliani Wanner  Specimen: Gallbladder  Complications: None  EBL: 15mL  Wound Classification: Clean Contaminated  Indications: see HPI  Findings: Critical view of safety noted Cystic duct and artery identified, ligated and divided, clips remained intact at end of procedure Adequate hemostasis  Description of procedure:  The patient was placed on the operating table in the supine position. SCDs placed, pre-op abx administered.  General anesthesia was induced and OG tube placed by anesthesia. A time-out was completed verifying correct patient, procedure, site, positioning, and implant(s) and/or special equipment prior to beginning this procedure. The abdomen was prepped and draped in the usual sterile fashion.    Veress needle was placed at the Palmer's point and insufflation was started after confirming a positive saline drop test and no immediate increase in abdominal pressure.  After reaching 15 mm, the Veress needle was removed and a 8 mm port was placed via optiview technique under umbilicus measured 20mm from gallbladder.  The abdomen was inspected and no abnormalities or injuries were found.  Under direct vision, ports were placed in the following locations: One 12 mm patient left of the umbilicus, 8cm from the optiviewed port, one 8 mm port placed to the patient right of the umbilical port 8 cm apart.  1 additional 8 mm port placed lateral to the 12mm port.  Once ports were placed, The table was placed in the reverse Trendelenburg position with the right side up. The Xi platform was brought into the operative field and docked to the ports successfully.  An endoscope was placed through the umbilical port, fenestrated grasper through the adjacent patient right port, prograsp to the far patient left port, and  then a hook cautery in the left port.  The dome of the gallbladder was grasped with prograsp, passed and retracted over the dome of the liver. Adhesions between the gallbladder and omentum, duodenum and transverse colon were lysed via hook cautery. The infundibulum was grasped with the fenestrated grasper and retracted toward the right lower quadrant. This maneuver exposed Calot's triangle. The peritoneum overlying the gallbladder infundibulum was then dissected using combination of [place] dissector and electrocautery hook and the cystic duct and cystic artery identified.  Critical view of safety with the liver bed clearly visible behind the duct and artery with no additional structures noted.  The cystic duct and cystic artery clipped and divided close to the gallbladder.     The gallbladder was then dissected from its peritoneal and liver bed attachments by electrocautery. Hemostasis was checked prior to removing the hook cautery and the Endo Catch bag was then placed through the 12 mm port and the gallbladder was removed.  The gallbladder was passed off the table as a specimen. There was no evidence of bleeding from the gallbladder fossa or cystic artery or leakage of the bile from the cystic duct stump. The 12 mm port site closed with PMI using 0 vicryl under direct vision.  Abdomen desufflated and secondary trocars were removed under direct vision. No bleeding was noted. All skin incisions then closed with subcuticular sutures of 4-0 monocryl and dressed with topical skin adhesive. The orogastric tube was removed and patient extubated.  The patient tolerated the procedure well and was taken to the postanesthesia care unit in stable condition.  All sponge and instrument count correct at end of procedure.  

## 2019-11-07 NOTE — Anesthesia Procedure Notes (Signed)
Procedure Name: Intubation Date/Time: 11/07/2019 10:28 AM Performed by: Elmarie Mainland, CRNA Pre-anesthesia Checklist: Patient identified, Emergency Drugs available, Suction available and Patient being monitored Patient Re-evaluated:Patient Re-evaluated prior to induction Oxygen Delivery Method: Circle system utilized Preoxygenation: Pre-oxygenation with 100% oxygen Induction Type: IV induction Ventilation: Mask ventilation without difficulty Laryngoscope Size: McGraph and 3 Grade View: Grade I Tube type: Oral Number of attempts: 1 Airway Equipment and Method: Stylet,  Oral airway and Video-laryngoscopy Placement Confirmation: ETT inserted through vocal cords under direct vision,  positive ETCO2 and breath sounds checked- equal and bilateral Secured at: 20 cm Tube secured with: Tape Dental Injury: Teeth and Oropharynx as per pre-operative assessment

## 2019-11-07 NOTE — Interval H&P Note (Signed)
History and Physical Interval Note:  11/07/2019 10:06 AM  Linda Kaufman  has presented today for surgery, with the diagnosis of K80.50    biliary colic.  The various methods of treatment have been discussed with the patient and family. After consideration of risks, benefits and other options for treatment, the patient has consented to  Procedure(s): XI ROBOTIC ASSISTED LAPAROSCOPIC CHOLECYSTECTOMY (N/A) as a surgical intervention.  The patient's history has been reviewed, patient examined, no change in status, stable for surgery.  I have reviewed the patient's chart and labs.  Questions were answered to the patient's satisfaction.     Karas Pickerill Tonna Boehringer

## 2019-11-08 LAB — SURGICAL PATHOLOGY

## 2022-03-11 IMAGING — US US ABDOMEN LIMITED
1 series · 14 of 25 positions shown · non-contrast
Comparison: None.

CLINICAL DATA: Right upper quadrant abdominal pain.

EXAM:
ULTRASOUND ABDOMEN LIMITED RIGHT UPPER QUADRANT

[Series 1: us abdomen limited ruq · 14 of 45 slices shown]
[im 1/45]
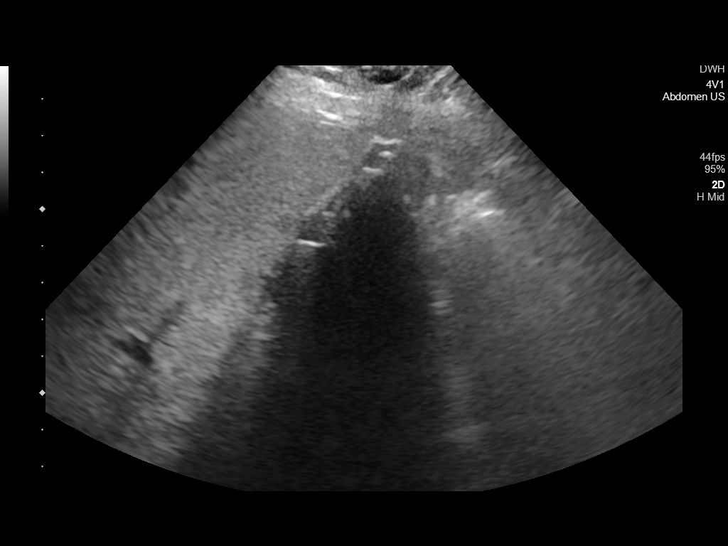
[im 4/45]
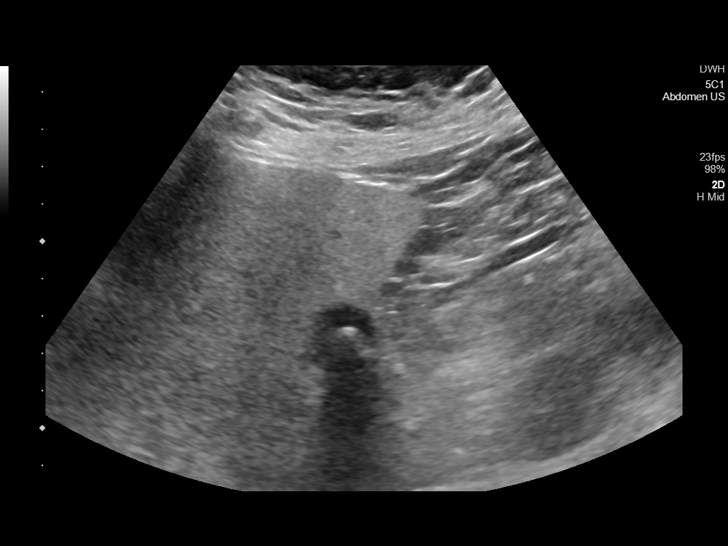
[im 8/45]
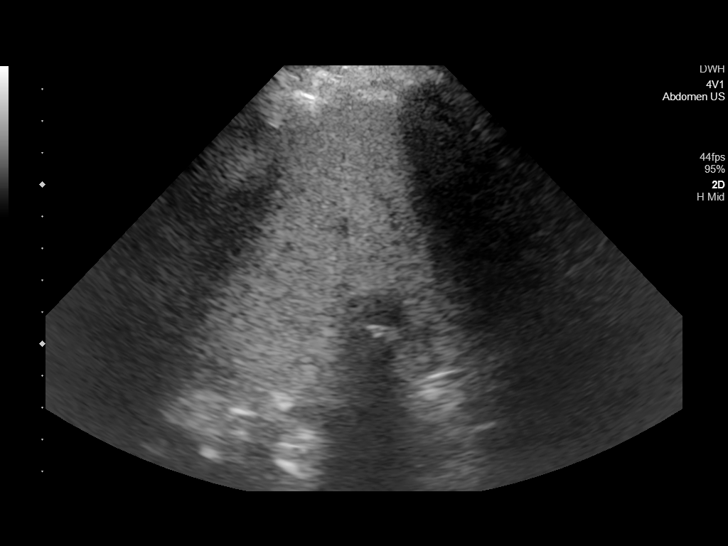
[im 12/45]
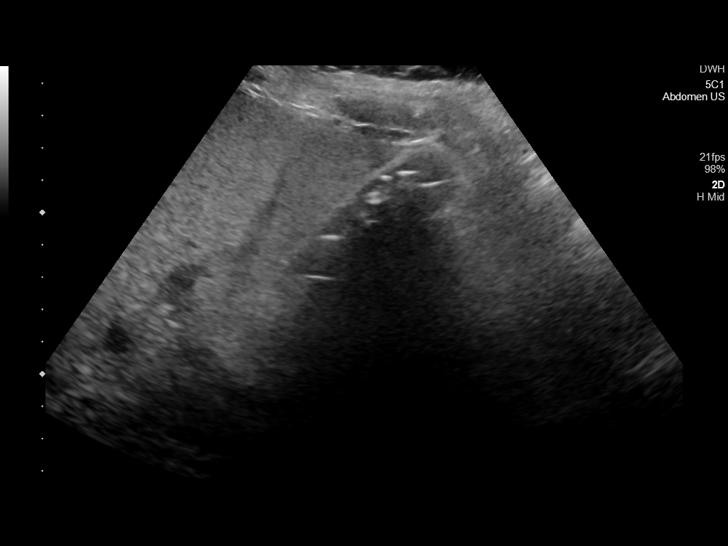
[im 15/45]
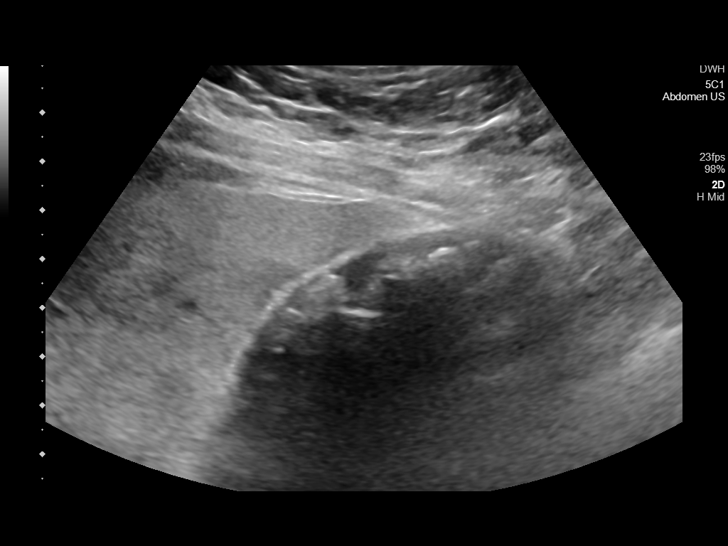
[im 17/45]
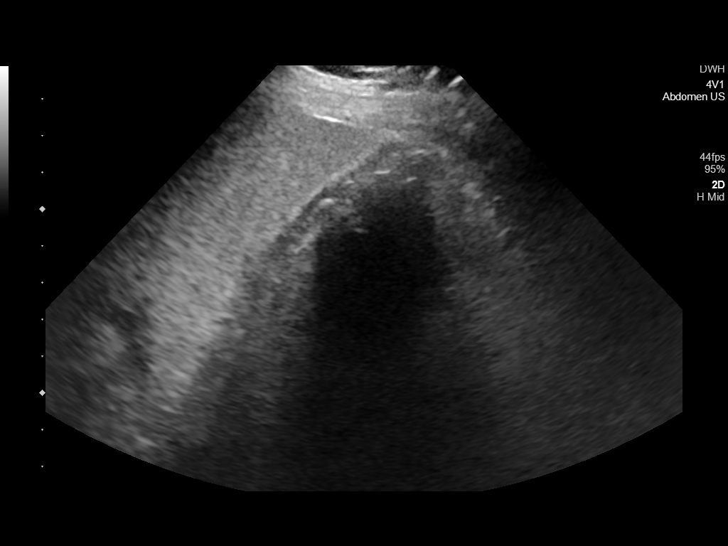
[im 21/45]
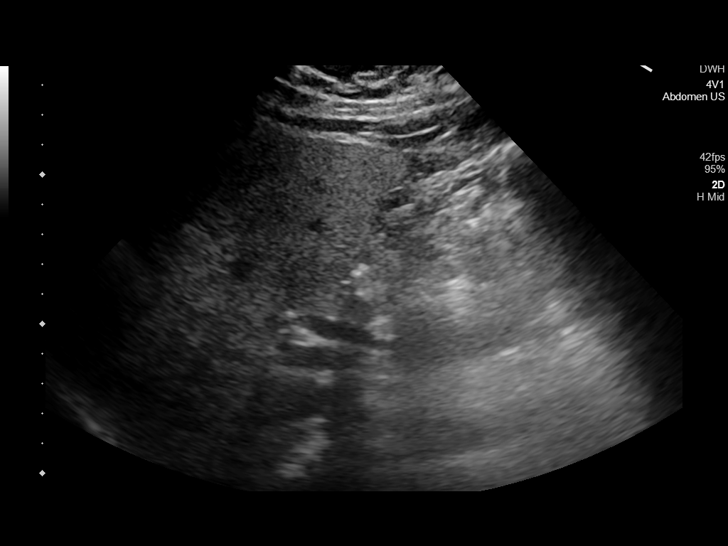
[im 24/45]
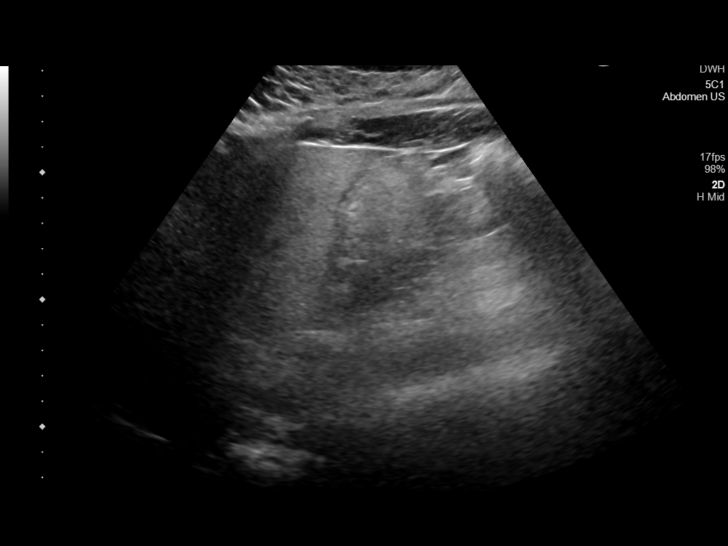
[im 28/45]
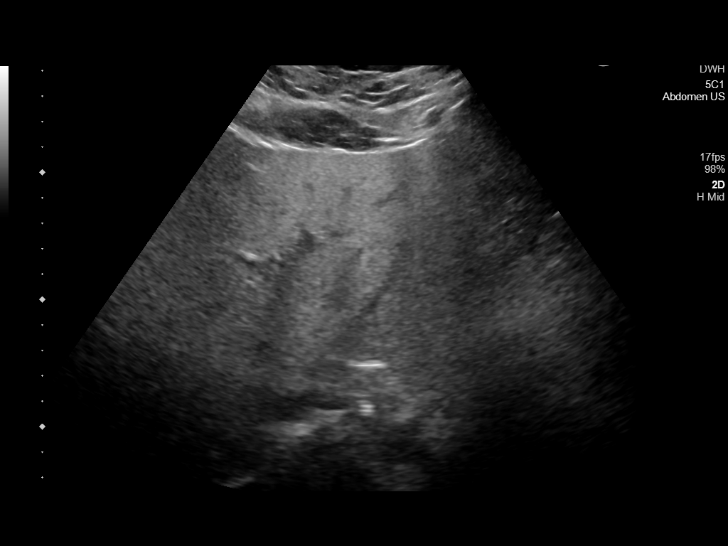
[im 30/45]
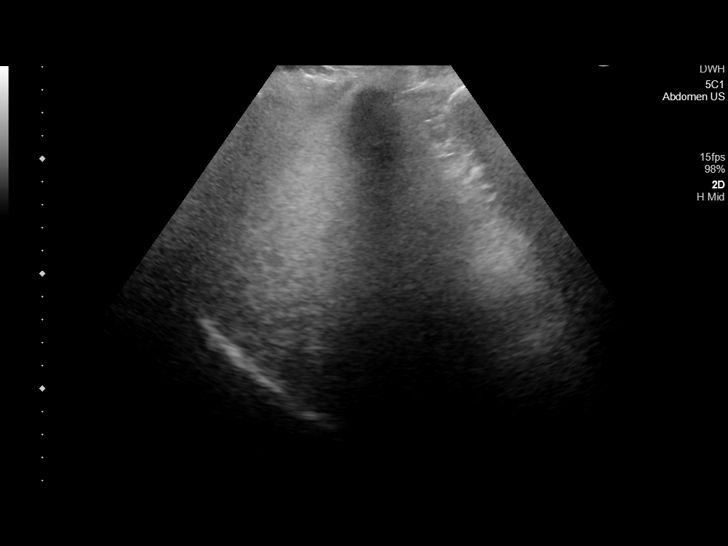
[im 34/45]
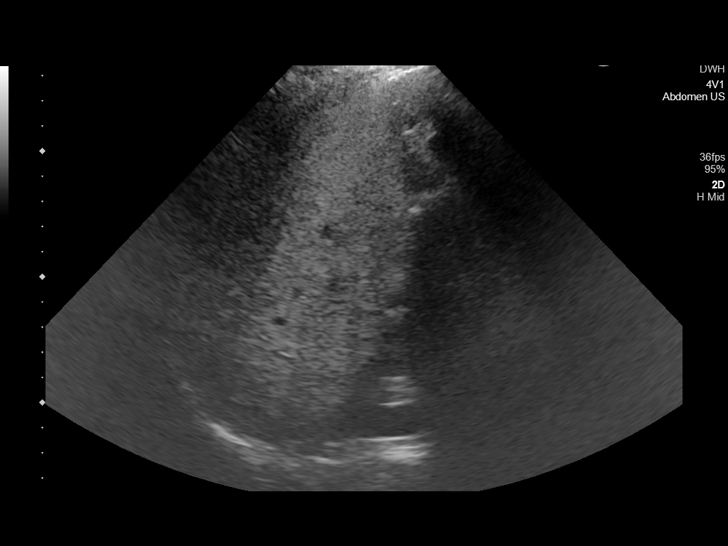
[im 37/45]
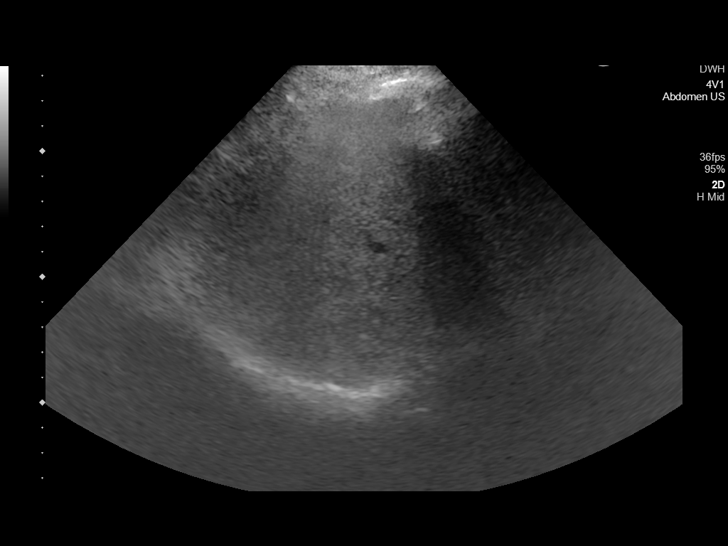
[im 41/45]
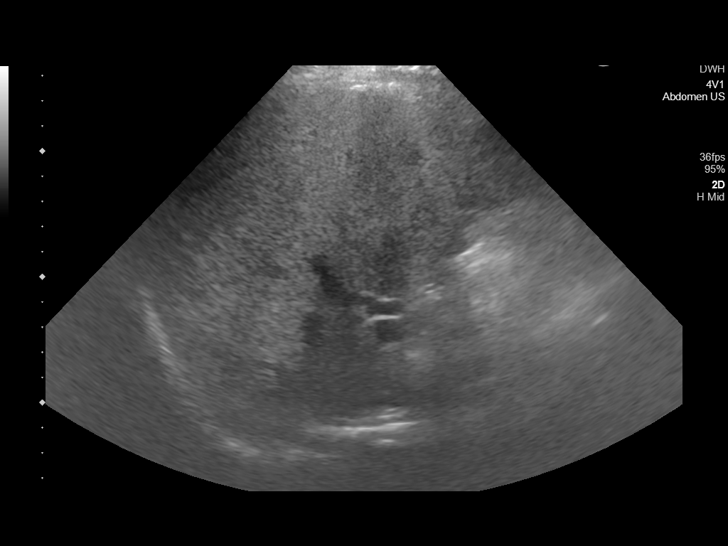
[im 45/45]
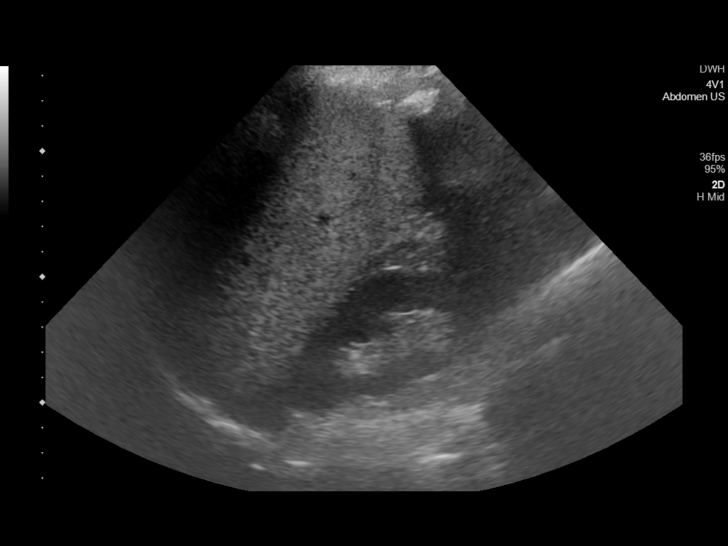

[14 of 25 positions shown; findings below may reference images not displayed]

FINDINGS: Gallbladder:

Multiple shadowing gallstones are present. The gallbladder is
contracted. Wall thickness is upper limits of normal at 3.5 mm.

Common bile duct:

Diameter: 6 mm, within normal limits.

Liver:

The liver is diffusely echogenic. No focal lesions are present.
Internal architecture is not well-defined. Portal vein is patent on
color Doppler imaging with normal direction of blood flow towards
the liver.

Other: None.
IMPRESSION: 1. Cholelithiasis.
2. Borderline gallbladder wall thickening is likely due to
contracted state without definite inflammatory change.
3. Diffuse fatty infiltration of the liver.

## 2022-07-28 ENCOUNTER — Other Ambulatory Visit: Payer: Self-pay

## 2022-07-28 ENCOUNTER — Emergency Department
Admission: EM | Admit: 2022-07-28 | Discharge: 2022-07-28 | Disposition: A | Discharge status: Home / Self Care | Payer: No Typology Code available for payment source | Attending: Emergency Medicine | Admitting: Emergency Medicine

## 2022-07-28 DIAGNOSIS — S39012A Strain of muscle, fascia and tendon of lower back, initial encounter: Secondary | ICD-10-CM | POA: Diagnosis not present

## 2022-07-28 DIAGNOSIS — Y92410 Unspecified street and highway as the place of occurrence of the external cause: Secondary | ICD-10-CM | POA: Diagnosis not present

## 2022-07-28 DIAGNOSIS — S161XXA Strain of muscle, fascia and tendon at neck level, initial encounter: Secondary | ICD-10-CM | POA: Diagnosis not present

## 2022-07-28 DIAGNOSIS — S199XXA Unspecified injury of neck, initial encounter: Secondary | ICD-10-CM | POA: Diagnosis present

## 2022-07-28 MED ORDER — CYCLOBENZAPRINE HCL 10 MG PO TABS
10.0000 mg | ORAL_TABLET | Freq: Once | ORAL | Status: AC
  Administered 2022-07-28: 10 mg via ORAL
  Filled 2022-07-28: qty 1

## 2022-07-28 MED ORDER — CYCLOBENZAPRINE HCL 5 MG PO TABS: 5.0000 mg | ORAL_TABLET | Freq: Three times a day (TID) | ORAL | 0 refills | Status: AC | PRN

## 2022-07-28 MED ORDER — KETOROLAC TROMETHAMINE 30 MG/ML IJ SOLN
30.0000 mg | Freq: Once | INTRAMUSCULAR | Status: AC
  Administered 2022-07-28: 30 mg via INTRAMUSCULAR
  Filled 2022-07-28: qty 1

## 2022-07-28 MED ORDER — MELOXICAM 15 MG PO TABS: 15.0000 mg | ORAL_TABLET | Freq: Every day | ORAL | 0 refills | Status: AC

## 2022-07-28 NOTE — Discharge Instructions (Signed)
Please use a heating pad to the lower back and neck as needed for cervical muscle or lumbar muscle tightness.  Take medications as prescribed.  He may use Tylenol for additional pain relief.  Return to the ER for any worsening symptoms or urgent changes in health
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
## 2023-03-22 ENCOUNTER — Encounter: Payer: Self-pay | Admitting: Family Medicine

## 2023-03-29 ENCOUNTER — Other Ambulatory Visit: Payer: Self-pay

## 2023-03-29 DIAGNOSIS — Z1231 Encounter for screening mammogram for malignant neoplasm of breast: Secondary | ICD-10-CM

## 2023-05-22 ENCOUNTER — Ambulatory Visit: Payer: Self-pay

## 2023-05-22 ENCOUNTER — Ambulatory Visit
Admission: RE | Admit: 2023-05-22 | Discharge: 2023-05-22 | Disposition: A | Discharge status: Home / Self Care | Payer: Self-pay | Source: Ambulatory Visit | Attending: Primary Care | Admitting: Primary Care

## 2023-05-22 DIAGNOSIS — Z1231 Encounter for screening mammogram for malignant neoplasm of breast: Secondary | ICD-10-CM | POA: Insufficient documentation

## 2023-05-30 ENCOUNTER — Other Ambulatory Visit: Payer: Self-pay | Admitting: Obstetrics and Gynecology

## 2023-05-30 DIAGNOSIS — R928 Other abnormal and inconclusive findings on diagnostic imaging of breast: Secondary | ICD-10-CM

## 2023-06-12 ENCOUNTER — Ambulatory Visit
Admission: RE | Admit: 2023-06-12 | Discharge: 2023-06-12 | Disposition: A | Discharge status: Home / Self Care | Payer: Self-pay | Source: Ambulatory Visit | Attending: Obstetrics and Gynecology | Admitting: Obstetrics and Gynecology

## 2023-06-12 ENCOUNTER — Ambulatory Visit: Payer: Self-pay | Attending: Obstetrics and Gynecology | Admitting: *Deleted

## 2023-06-12 VITALS — BP 140/74 | Wt 146.8 lb

## 2023-06-12 DIAGNOSIS — R928 Other abnormal and inconclusive findings on diagnostic imaging of breast: Secondary | ICD-10-CM

## 2023-06-12 DIAGNOSIS — Z1239 Encounter for other screening for malignant neoplasm of breast: Secondary | ICD-10-CM

## 2023-06-12 NOTE — Patient Instructions (Signed)
 Explained breast self awareness with Cindy Creed. Patient did not need a Pap smear today due to last Pap smear and HPV typing was 03/21/2023. Let her know BCCCP will cover Pap smears and HPV typing every 5 years unless has a history of abnormal Pap smears. Referred patient to the [facility] for a diagnostic mammogram per recommendation. Appointment scheduled Monday, June 9;2025 at 1400. Patient aware of appointment and will be there. Andalyn Heckstall verbalized understanding.  Courvoisier Hamblen, Dela Favor, RN 11:41 AM

## 2023-06-12 NOTE — Progress Notes (Signed)
 Ms. Linda Kaufman is a 48 y.o. female who presents to [facility] clinic today with no complaints. Patient had a screening mammogram completed 05/22/2023 that additional imaging of the right breast is recommended for follow up.   Pap Smear: Pap smear not completed today. Last Pap smear was 03/21/2023 at [facility] clinic and was normal with negative HPV. Per patient has no history of an abnormal Pap smear. Last Pap smear result is available in Epic.   Physical exam: Breasts Breasts symmetrical. No skin abnormalities bilateral breasts. No nipple retraction right breast. Left nipple slightly inverted that per patient is normal for her. No nipple discharge bilateral breasts. No lymphadenopathy. No lumps palpated bilateral breasts. No complaints of pain or tenderness on exam.  MS 3D SCR MAMMO BILAT BR (aka MM) Addendum Date: 06/09/2023 ADDENDUM REPORT: 06/09/2023 07:28 ADDENDUM: Laterality error in the original report. The possible mass is on the left. There are no findings suspicious for malignancy on the right. The recommendation should be for diagnostic mammography and possible ultrasound of the left breast. Electronically Signed   By: Amanda Jungling M.D.   On: 06/09/2023 07:28   Result Date: 06/09/2023 CLINICAL DATA:  Screening. EXAM: DIGITAL SCREENING BILATERAL MAMMOGRAM WITH TOMOSYNTHESIS AND CAD TECHNIQUE: Bilateral screening digital craniocaudal and mediolateral oblique mammograms were obtained. Bilateral screening digital breast tomosynthesis was performed. The images were evaluated with computer-aided detection. COMPARISON:  None.  Baseline study. ACR Breast Density Category c: The breasts are heterogeneously dense, which may obscure small masses. FINDINGS: In the right breast, a possible mass warrants further evaluation. In the left breast, no findings suspicious for malignancy. IMPRESSION: Further evaluation is suggested for a possible mass in the right breast. RECOMMENDATION: Diagnostic mammogram  and possibly ultrasound of the right breast. (Code:[id number]) The patient will be contacted regarding the findings, and additional imaging will be scheduled. BI-RADS CATEGORY  0: Incomplete: Need additional imaging evaluation. Electronically Signed: By: Amanda Jungling M.D. On: 05/25/2023 11:43       Pelvic/Bimanual Pap is not indicated today per BCCCP guidelines.   Smoking History: Patient has never smoked.  Patient Navigation: Patient education provided. Access to services provided for patient through [employer] program. Spanish interpreter Linda Kaufman from [facility] provided.   Colorectal Cancer Screening: Per patient has never had colonoscopy completed. Per patient completed the cologuard test 03/27/2023 at [facility] and it was negative. No complaints today.    Breast and Cervical Cancer Risk Assessment: Patient does not have family history of breast cancer, known genetic mutations, or radiation treatment to the chest before age 44. Patient does not have history of cervical dysplasia, immunocompromised, or DES exposure in-utero.  Risk Scores as of Encounter on 06/12/2023     Linda Kaufman           5-year 0.57%   Lifetime 6.23%   This patient is [place] but has no documented birth country, so the [place] model used data from [place] patients to calculate their risk score. Document a birth country in the Demographics activity for a more accurate score.         Last calculated by Linda Ingle, LPN on 09/09/2950 at 11:42 AM       A: BCCCP exam without pap smear No complaints.  P: Referred patient to the [facility] for a diagnostic mammogram per recommendation. Appointment scheduled Monday, June 9;2025 at 1400.Linda Edge, RN 06/12/2023 11:41 AM

## 2023-06-15 ENCOUNTER — Other Ambulatory Visit: Payer: Self-pay | Admitting: Obstetrics and Gynecology

## 2023-06-15 DIAGNOSIS — R928 Other abnormal and inconclusive findings on diagnostic imaging of breast: Secondary | ICD-10-CM

## 2023-06-22 ENCOUNTER — Ambulatory Visit
Admission: RE | Admit: 2023-06-22 | Discharge: 2023-06-22 | Disposition: A | Discharge status: Home / Self Care | Payer: Self-pay | Source: Ambulatory Visit | Attending: Obstetrics and Gynecology | Admitting: Obstetrics and Gynecology

## 2023-06-22 ENCOUNTER — Other Ambulatory Visit: Payer: Self-pay | Admitting: Obstetrics and Gynecology

## 2023-06-22 ENCOUNTER — Ambulatory Visit
Admission: RE | Admit: 2023-06-22 | Discharge: 2023-06-22 | Discharge status: Home / Self Care | Payer: Self-pay | Source: Ambulatory Visit | Attending: Obstetrics and Gynecology | Admitting: Obstetrics and Gynecology

## 2023-06-22 DIAGNOSIS — R928 Other abnormal and inconclusive findings on diagnostic imaging of breast: Secondary | ICD-10-CM

## 2023-06-22 HISTORY — PX: BREAST BIOPSY: SHX20

## 2023-06-22 MED ORDER — LIDOCAINE 1 % OPTIME INJ - NO CHARGE
2.0000 mL | Freq: Once | INTRAMUSCULAR | Status: AC
  Administered 2023-06-22: 2 mL
  Filled 2023-06-22: qty 2

## 2023-06-22 MED ORDER — LIDOCAINE-EPINEPHRINE 1 %-1:100000 IJ SOLN
8.0000 mL | Freq: Once | INTRAMUSCULAR | Status: AC
  Administered 2023-06-22: 8 mL

## 2023-06-23 LAB — SURGICAL PATHOLOGY
